# Patient Record
Sex: Female | Born: 1960 | Race: Black or African American | Smoking: Former smoker
Health system: Southern US, Community
[De-identification: ages and names within clinical notes are randomized; demographics above are authoritative.]

## PROBLEM LIST (undated history)

## (undated) DIAGNOSIS — K219 Gastro-esophageal reflux disease without esophagitis: Secondary | ICD-10-CM

## (undated) DIAGNOSIS — D332 Benign neoplasm of brain, unspecified: Secondary | ICD-10-CM

## (undated) HISTORY — PX: CHOLECYSTECTOMY: SHX55

## (undated) HISTORY — PX: ABDOMINAL HYSTERECTOMY: SHX81

## (undated) HISTORY — PX: KNEE SURGERY: SHX244

## (undated) HISTORY — PX: VENTRICULOPERITONEAL SHUNT: SHX204

---

## 2015-05-11 ENCOUNTER — Ambulatory Visit: Payer: Self-pay | Admitting: Physical Therapy

## 2015-05-26 ENCOUNTER — Ambulatory Visit: Payer: Medicaid Other | Attending: Anesthesiology | Admitting: Physical Therapy

## 2015-05-26 DIAGNOSIS — M545 Low back pain: Secondary | ICD-10-CM

## 2015-05-26 DIAGNOSIS — R209 Unspecified disturbances of skin sensation: Secondary | ICD-10-CM

## 2015-05-26 DIAGNOSIS — R208 Other disturbances of skin sensation: Secondary | ICD-10-CM | POA: Insufficient documentation

## 2015-05-26 DIAGNOSIS — M5416 Radiculopathy, lumbar region: Secondary | ICD-10-CM | POA: Diagnosis present

## 2015-05-26 DIAGNOSIS — R29898 Other symptoms and signs involving the musculoskeletal system: Secondary | ICD-10-CM

## 2015-05-26 NOTE — Patient Instructions (Signed)
  Copyright  VHI. All rights reserved.   Pelvic Tilt   Flatten back by tightening stomach muscles and buttocks. Repeat _10__ times per set. Do ___2_ sets per session. Do __2__ sessions per day.  http://orth.exer.us/134   Copyright  VHI. All rights reserved. Knee to Chest (Flexion)   Pull knee toward chest. Feel stretch in lower back or buttock area. Breathing deeply, Hold _20-30___ seconds. Repeat with other knee. Repeat __2-3__ times. Do ___2_ sessions per day.  http://gt2.exer.us/225   Copyright  VHI. All rights reserved.   Lower Trunk Rotation Stretch   Keeping back flat and feet together, rotate knees to left side. Hold __10_ seconds. Repeat __10__ times per set. Do ___2_ sets per session. Do _2___ sessions per day.  http://orth.exer.us/122   Copyright  VHI. All rights reserved.  Supine: Leg Stretch With Strap (Basic)   Lie on back with one knee bent, foot flat on floor. Hook strap around other foot. Straighten knee. Keep knee level with other knee. Hold ___30 seconds. Relax leg completely down to floor.  Repeat _2-3__ times per session. Do __2_ sessions per day.  Copyright  VHI. All rights reserved.    http://gt2.exer.us/247   HIP: Hamstrings - Short Sitting   Rest leg on raised surface. Keep knee straight. Lift chest. Hold __30 seconds. __3_ reps per set, __2_ sets per day, 5-7__ days per week  Copyright  VHI. All rights reserved.

## 2015-05-26 NOTE — Therapy (Addendum)
[facility] [place], [place], [address] Phone: [phone]   Fax:  [phone]  Physical Therapy Evaluation/Discharge  Patient Details  Name: Wendy Aguirre MRN: [id number] Date of Birth: 24-Feb-1961 Referring Provider:  Shanon Ace,*  Encounter Date: 05/26/2015      PT End of Session - 05/26/15 1547    Visit Number 1   Number of Visits 1   PT Start Time 1420   PT Stop Time 1530   PT Time Calculation (min) 70 min   Activity Tolerance Patient tolerated treatment well      No past medical history on file.  No past surgical history on file.  There were no vitals filed for this visit.  Visit Diagnosis:  Left lumbar radiculopathy  Weakness of left lower extremity  Sensory disturbance  Left low back pain, with sciatica presence unspecified      Subjective Assessment - 05/26/15 1426    Subjective Pt has central low back pain,  L hip pain which radiates to L lower leg.  She reports numbness, tingling and LLE weakness.  Symptoms have been going on >8 mos.    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 5 min   How long can you stand comfortably? 3 min    How long can you walk comfortably? hip pops with walking, 5 min    Diagnostic tests MRI L Spine   Currently in Pain? Yes   Pain Score 8    Pain Location Back  Hip   Pain Orientation Left;Proximal   Pain Descriptors / Indicators Constant;Burning;Aching   Pain Type Chronic pain   Pain Onset More than a month ago   Pain Frequency Constant   Aggravating Factors  pronlonged positions   Pain Relieving Factors nothing really (ice > heat)   Multiple Pain Sites No            [facility] PT Assessment - 05/26/15 1434    Assessment   Medical Diagnosis lumbago, L hip   Onset Date/Surgical Date 09/25/14   Next MD Visit unknown   Prior Therapy for knee in [place]   Precautions   Precautions None   Restrictions   Weight Bearing Restrictions No   Balance Screen   Has the patient fallen in the past 6 months No   Has the patient had a decrease in activity level because of a fear of falling?  No   Is the patient reluctant to leave their home because of a fear of falling?  No   Home Ecologist residence   Prior Function   Level of Independence Independent   Vocation On disability   Vocation Requirements moved from [place] recently   Cognition   Overall Cognitive Status Within Functional Limits for tasks assessed   Sensation   Light Touch Impaired by gross assessment  LLE   Coordination   Gross Motor Movements are Fluid and Coordinated Not tested   AROM   Lumbar Flexion 45   Lumbar Extension 15  pain   Lumbar - Right Side Bend pain   Lumbar - Left Side Bend min pain   Lumbar - Right Rotation 50% min pain   Lumbar - Left Rotation 50% min pain   Strength   Right Hip Flexion 4/5   Left Hip Flexion 3-/5   Right Knee Flexion 4/5   Right Knee Extension 5/5   Left Knee Flexion  3+/5   Left Knee Extension 3+/5   Right/Left Ankle Left   Right Ankle Dorsiflexion 5/5   Left Ankle Dorsiflexion 3+/5   Palpation   Palpation comment painful with all palpation to L glute, L lateral hip and into Lt. ITB, quads   Straight Leg Raise   Findings Positive   Side  Right   Comment --  pain end range on Lt. LE, limited by knee pain also           PT Education - 05/26/15 1546    Education provided Yes   Education Details PT/POC, HEP, posture and body mech, TENS contact (hers does not work) and Geologist, engineering) Educated Patient   Methods Explanation;Demonstration;Handout   Comprehension Verbalized understanding;Returned demonstration             PT Long Term Goals - 05/26/15 1550    PT LONG TERM GOAL #1   Title No goals due to no follow up- no PT coverage               Plan - 05/26/15 1547     Clinical Impression Statement Patient with symptoms consistent with L lumbar radiculopathy. Lumbar traction indicated due to pos SLR on uninvolved side.  Reported min to no leg pain post treatment.  Unfortunately no coverage for PT with [facility].  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 Asst.    Pt will benefit from skilled therapeutic intervention in order to improve on the following deficits Decreased range of motion;Difficulty walking;Increased fascial restricitons;Obesity;Decreased endurance;Decreased activity tolerance;Pain;Improper body mechanics;Impaired flexibility;Hypomobility;Decreased mobility;Decreased strength;Impaired sensation;Postural dysfunction   Rehab Potential Good   PT Frequency One time visit   PT Treatment/Interventions Traction;Therapeutic exercise;ADLs/Self Care Home Management   PT Next Visit Plan NA   PT Home Exercise Plan given basic back ex   Consulted and Agree with Plan of Care Patient         Problem List There are no active problems to display for this patient.   PAA,JENNIFER 05/26/2015, 3:53 PM  [facility] [address] [place], [place], [address] Phone: [phone]   Fax:  [phone]   Raeford Razor, PT 05/26/2015 3:54 PM Phone: [phone] Fax: [phone]

## 2015-07-13 ENCOUNTER — Other Ambulatory Visit: Payer: Self-pay | Admitting: Family Medicine

## 2015-07-13 DIAGNOSIS — Z1231 Encounter for screening mammogram for malignant neoplasm of breast: Secondary | ICD-10-CM

## 2015-07-23 ENCOUNTER — Ambulatory Visit: Payer: Medicaid Other

## 2015-08-05 ENCOUNTER — Ambulatory Visit: Payer: Medicaid Other

## 2015-08-23 ENCOUNTER — Other Ambulatory Visit: Payer: Self-pay

## 2015-08-23 DIAGNOSIS — Z1231 Encounter for screening mammogram for malignant neoplasm of breast: Secondary | ICD-10-CM

## 2015-09-09 ENCOUNTER — Ambulatory Visit
Admission: RE | Admit: 2015-09-09 | Discharge: 2015-09-09 | Disposition: A | Discharge status: Home / Self Care | Payer: Medicaid Other | Source: Ambulatory Visit

## 2015-09-09 DIAGNOSIS — Z1231 Encounter for screening mammogram for malignant neoplasm of breast: Secondary | ICD-10-CM

## 2015-10-05 ENCOUNTER — Encounter (HOSPITAL_COMMUNITY): Payer: Self-pay

## 2015-10-05 ENCOUNTER — Emergency Department (HOSPITAL_COMMUNITY)
Admission: EM | Admit: 2015-10-05 | Discharge: 2015-10-05 | Disposition: A | Discharge status: Home / Self Care | Payer: Medicare Other | Attending: Emergency Medicine | Admitting: Emergency Medicine

## 2015-10-05 ENCOUNTER — Emergency Department (HOSPITAL_COMMUNITY): Payer: Medicare Other

## 2015-10-05 DIAGNOSIS — Z87891 Personal history of nicotine dependence: Secondary | ICD-10-CM | POA: Insufficient documentation

## 2015-10-05 DIAGNOSIS — Z880 Allergy status to penicillin: Secondary | ICD-10-CM | POA: Diagnosis not present

## 2015-10-05 DIAGNOSIS — Z3202 Encounter for pregnancy test, result negative: Secondary | ICD-10-CM | POA: Insufficient documentation

## 2015-10-05 DIAGNOSIS — R109 Unspecified abdominal pain: Secondary | ICD-10-CM | POA: Insufficient documentation

## 2015-10-05 DIAGNOSIS — K219 Gastro-esophageal reflux disease without esophagitis: Secondary | ICD-10-CM | POA: Insufficient documentation

## 2015-10-05 DIAGNOSIS — Z79899 Other long term (current) drug therapy: Secondary | ICD-10-CM | POA: Diagnosis not present

## 2015-10-05 DIAGNOSIS — Z86011 Personal history of benign neoplasm of the brain: Secondary | ICD-10-CM | POA: Insufficient documentation

## 2015-10-05 DIAGNOSIS — R0781 Pleurodynia: Secondary | ICD-10-CM | POA: Diagnosis not present

## 2015-10-05 HISTORY — DX: Gastro-esophageal reflux disease without esophagitis: K21.9

## 2015-10-05 HISTORY — DX: Benign neoplasm of brain, unspecified: D33.2

## 2015-10-05 LAB — CBC WITH DIFFERENTIAL/PLATELET
BASOS ABS: 0.1 10*3/uL (ref 0.0–0.1)
BASOS PCT: 2 %
EOS ABS: 0.2 10*3/uL (ref 0.0–0.7)
Eosinophils Relative: 4 %
HCT: 41.2 % (ref 36.0–46.0)
HEMOGLOBIN: 13.2 g/dL (ref 12.0–15.0)
Lymphocytes Relative: 40 %
Lymphs Abs: 2.4 10*3/uL (ref 0.7–4.0)
MCH: 28 pg (ref 26.0–34.0)
MCHC: 32 g/dL (ref 30.0–36.0)
MCV: 87.5 fL (ref 78.0–100.0)
Monocytes Absolute: 0.3 10*3/uL (ref 0.1–1.0)
Monocytes Relative: 5 %
NEUTROS PCT: 49 %
Neutro Abs: 3.1 10*3/uL (ref 1.7–7.7)
Platelets: 311 10*3/uL (ref 150–400)
RBC: 4.71 MIL/uL (ref 3.87–5.11)
RDW: 15 % (ref 11.5–15.5)
WBC: 6.1 10*3/uL (ref 4.0–10.5)

## 2015-10-05 LAB — COMPREHENSIVE METABOLIC PANEL
ALBUMIN: 4.3 g/dL (ref 3.5–5.0)
ALK PHOS: 128 U/L — AB (ref 38–126)
ALT: 39 U/L (ref 14–54)
AST: 39 U/L (ref 15–41)
Anion gap: 10 (ref 5–15)
BUN: 11 mg/dL (ref 6–20)
CALCIUM: 9.7 mg/dL (ref 8.9–10.3)
CO2: 24 mmol/L (ref 22–32)
CREATININE: 1.07 mg/dL — AB (ref 0.44–1.00)
Chloride: 106 mmol/L (ref 101–111)
GFR calc Af Amer: >60 mL/min (ref >60)
GFR calc non Af Amer: 58 mL/min — ABNORMAL LOW (ref >60)
GLUCOSE: 148 mg/dL — AB (ref 65–99)
Potassium: 4.8 mmol/L (ref 3.5–5.1)
SODIUM: 140 mmol/L (ref 135–145)
Total Bilirubin: 0.9 mg/dL (ref 0.3–1.2)
Total Protein: 7.7 g/dL (ref 6.5–8.1)

## 2015-10-05 LAB — URINALYSIS, ROUTINE W REFLEX MICROSCOPIC
BILIRUBIN URINE: NEGATIVE
Glucose, UA: NEGATIVE mg/dL
Hgb urine dipstick: NEGATIVE
KETONES UR: NEGATIVE mg/dL
Leukocytes, UA: NEGATIVE
NITRITE: NEGATIVE
Protein, ur: NEGATIVE mg/dL
SPECIFIC GRAVITY, URINE: 1.028 (ref 1.005–1.030)
pH: 6.5 (ref 5.0–8.0)

## 2015-10-05 LAB — LIPASE, BLOOD: Lipase: 27 U/L (ref 11–51)

## 2015-10-05 LAB — HCG, QUANTITATIVE, PREGNANCY: HCG, BETA CHAIN, QUANT, S: 3 m[IU]/mL (ref <5)

## 2015-10-05 MED ORDER — SODIUM CHLORIDE 0.9 % IV BOLUS (SEPSIS)
1000.0000 mL | Freq: Once | INTRAVENOUS | Status: AC
  Administered 2015-10-05: 1000 mL via INTRAVENOUS

## 2015-10-05 MED ORDER — MORPHINE SULFATE (PF) 4 MG/ML IV SOLN
4.0000 mg | Freq: Once | INTRAVENOUS | Status: AC
  Administered 2015-10-05: 4 mg via INTRAVENOUS
  Filled 2015-10-05: qty 1

## 2015-10-05 MED ORDER — GI COCKTAIL ~~LOC~~
30.0000 mL | Freq: Once | ORAL | Status: AC
Start: 2015-10-05 — End: 2015-10-05
  Administered 2015-10-05: 30 mL via ORAL
  Filled 2015-10-05: qty 30

## 2015-10-05 MED ORDER — ONDANSETRON HCL 4 MG/2ML IJ SOLN
4.0000 mg | Freq: Once | INTRAMUSCULAR | Status: AC
  Administered 2015-10-05: 4 mg via INTRAVENOUS
  Filled 2015-10-05: qty 2

## 2015-10-05 NOTE — Discharge Instructions (Signed)
Flank Pain °Flank pain refers to pain that is located on the side of the body between the upper abdomen and the back. The pain may occur over a short period of time (acute) or may be long-term or reoccurring (chronic). It may be mild or severe. Flank pain can be caused by many things. °CAUSES  °Some of the more common causes of flank pain include: °· Muscle strains.   °· Muscle spasms.   °· A disease of your spine (vertebral disk disease).   °· A lung infection (pneumonia).   °· Fluid around your lungs (pulmonary edema).   °· A kidney infection.   °· Kidney stones.   °· A very painful skin rash caused by the chickenpox virus (shingles).   °· Gallbladder disease.   °HOME CARE INSTRUCTIONS  °Home care will depend on the cause of your pain. In general, °· Rest as directed by your caregiver. °· Drink enough fluids to keep your urine clear or pale yellow. °· Only take over-the-counter or prescription medicines as directed by your caregiver. Some medicines may help relieve the pain. °· Tell your caregiver about any changes in your pain. °· Follow up with your caregiver as directed. °SEEK IMMEDIATE MEDICAL CARE IF:  °· Your pain is not controlled with medicine.   °· You have new or worsening symptoms. °· Your pain increases.   °· You have abdominal pain.   °· You have shortness of breath.   °· You have persistent nausea or vomiting.   °· You have swelling in your abdomen.   °· You feel faint or pass out.   °· You have blood in your urine. °· You have a fever or persistent symptoms for more than 2-3 days. °· You have a fever and your symptoms suddenly get worse. °MAKE SURE YOU:  °· Understand these instructions. °· Will watch your condition. °· Will get help right away if you are not doing well or get worse. °  °This information is not intended to replace advice given to you by your health care provider. Make sure you discuss any questions you have with your health care provider. °  °Document Released: 12/21/2005 Document  Revised: 07/24/2012 Document Reviewed: 06/13/2012 °Elsevier Interactive Patient Education ©2016 Elsevier Inc. ° °

## 2015-10-05 NOTE — ED Notes (Signed)
Pt. Presents with complaint of R flank pain. Pt. States pain has been presents for approx 2 months, but has worsened over the past 2 days. Pt. Ambulatory, AxO x4. Pt. Denies urinary symptoms. 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

## 2015-10-05 NOTE — ED Provider Notes (Signed)
CSN: [id number]     Arrival date & time 10/05/15  1033 History   First MD Initiated Contact with Patient 10/05/15 1035     Chief Complaint  Patient presents with  . Flank Pain     (Consider location/radiation/quality/duration/timing/severity/associated sxs/prior Treatment) Patient is a 54 y.o. female presenting with flank pain. The history is provided by the patient.  Flank Pain This is a new problem. The current episode started more than 1 week ago. The problem occurs constantly. The problem has not changed since onset.Pertinent negatives include no chest pain, no abdominal pain, no headaches and no shortness of breath. Nothing aggravates the symptoms. Nothing relieves the symptoms. She has tried nothing for the symptoms. The treatment provided no relief.    54 yo F with a chief complaint of right flank pain. This been going on for about 2 months. Patient has been seen at an outside ED had a CT scan with contrast that was negative for acute process. Patient has a history of a VP shunt, and has a history of chronic pain related to her right side. Patient is seen currently by pain management. Patient is also obtaining narcotics from her family doctor. Patient also has a history of chronic abdominal pain for which she sees GI. Currently thought that her recurrent pain is secondary to GERD. Currently getting 120 15 mg oxycodone tablets for 30 day supply. Last filled 8 days ago. Patient denies any fevers or chills has nausea but denies vomiting. Patient denies any dysuria. Pain is constant worse with movement or walking. Patient denies history of nephrolithiasis.  Past Medical History  Diagnosis Date  . GERD (gastroesophageal reflux disease)   . Brain tumor (benign) [facility])    Past Surgical History  Procedure Laterality Date  . Ventriculoperitoneal shunt    . Abdominal hysterectomy    . Cholecystectomy    . Knee surgery      L side   No family history on file. Social History  Substance Use  Topics  . Smoking status: Former Research scientist (life sciences)  . Smokeless tobacco: None  . Alcohol Use: No   OB History    No data available     Review of Systems  Constitutional: Negative for fever and chills.  HENT: Negative for congestion and rhinorrhea.   Eyes: Negative for redness and visual disturbance.  Respiratory: Negative for shortness of breath and wheezing.   Cardiovascular: Negative for chest pain and palpitations.  Gastrointestinal: Negative for nausea, vomiting, abdominal pain and abdominal distention.  Genitourinary: Positive for flank pain (R side). Negative for dysuria, urgency, hematuria, decreased urine volume, vaginal bleeding, vaginal discharge and difficulty urinating.  Musculoskeletal: Negative for myalgias and arthralgias.  Skin: Negative for pallor and wound.  Neurological: Negative for dizziness and headaches.      Allergies  Lamotrigine; Penicillins; and Carbamazepine  Home Medications   Prior to Admission medications   Medication Sig Start Date End Date Taking? Authorizing Provider  cyclobenzaprine (FLEXERIL) 10 MG tablet Take 10 mg by mouth 3 (three) times daily as needed for muscle spasms.   Yes Historical Provider, MD  gabapentin (NEURONTIN) 800 MG tablet Take 800 mg by mouth 3 (three) times daily.   Yes Historical Provider, MD  omeprazole (PRILOSEC) 40 MG capsule Take 40 mg by mouth 2 (two) times daily.    Yes Historical Provider, MD  ondansetron (ZOFRAN-ODT) 4 MG disintegrating tablet Take 4 mg by mouth every 8 (eight) hours as needed for nausea or vomiting.   Yes Historical Provider,  MD  oxyCODONE-acetaminophen (PERCOCET/ROXICET) 5-325 MG per tablet Take by mouth every 4 (four) hours as needed for severe pain.   Yes Historical Provider, MD  rOPINIRole (REQUIP) 0.5 MG tablet Take 0.5 mg by mouth 3 (three) times daily.   Yes Historical Provider, MD   BP 125/78 mmHg  Pulse 62  Temp(Src) 97.3 F (36.3 C) (Oral)  Resp 16  Ht 5\' 2"  (1.575 m)  Wt 179 lb (81.194 kg)   BMI 32.73 kg/m2  SpO2 98% Physical Exam  Constitutional: She is oriented to person, place, and time. She appears well-developed and well-nourished. No distress.  HENT:  Head: Normocephalic and atraumatic.  Eyes: EOM are normal. Pupils are equal, round, and reactive to light.  Neck: Normal range of motion. Neck supple.  Cardiovascular: Normal rate and regular rhythm.  Exam reveals no gallop and no friction rub.   No murmur heard. Pulmonary/Chest: Effort normal. She has no wheezes. She has no rales. She exhibits tenderness (tender to palpation right about the right posterior rib angles about ribs 8 through 10).  Abdominal: Soft. She exhibits no distension. There is no tenderness. There is no rebound and no guarding.  Mild right sided cva ttp  Musculoskeletal: She exhibits no edema or tenderness.  Neurological: She is alert and oriented to person, place, and time.  Skin: Skin is warm and dry. She is not diaphoretic.  Psychiatric: She has a normal mood and affect. Her behavior is normal.  Nursing note and vitals reviewed.   ED Course  Procedures (including critical care time) Labs Review Labs Reviewed  URINALYSIS, ROUTINE W REFLEX MICROSCOPIC (NOT AT [facility]) - Abnormal; Notable for the following:    APPearance CLOUDY (*)    All other components within normal limits  COMPREHENSIVE METABOLIC PANEL - Abnormal; Notable for the following:    Glucose, Bld 148 (*)    Creatinine, Ser 1.07 (*)    Alkaline Phosphatase 128 (*)    GFR calc non Af Amer 58 (*)    All other components within normal limits  CBC WITH DIFFERENTIAL/PLATELET  LIPASE, BLOOD  HCG, QUANTITATIVE, PREGNANCY    Imaging Review Ct Renal Stone Study  10/05/2015  CLINICAL DATA:  Right flank pain for 2 months, worse recently EXAM: CT ABDOMEN AND PELVIS WITHOUT CONTRAST TECHNIQUE: Multidetector CT imaging of the abdomen and pelvis was performed following the standard protocol without IV contrast. COMPARISON:  None. FINDINGS:  The lung bases are clear. The liver is unremarkable on this unenhanced study. Surgical clips are present from prior cholecystectomy. The pancreas is normal in size and the pancreatic duct is not dilated. The adrenal glands and spleen are unremarkable. There does appear to be a small hiatal hernia present. Otherwise the stomach is decompressed and unremarkable. The descending duodenum appears somewhat dilated, a finding of questionable significance. No renal calculi are seen. There is no evidence of hydronephrosis. The proximal ureters are normal in caliber. The abdominal aorta is normal in caliber. A probable VP shunt catheter is noted with the tip extending into the anterior left pelvis. There is a tiny amount of fluid within the pelvis of questionable significance. This may represent a recently ruptured ovarian cyst with ovaries appear normal in size. The uterus has previously been resected. The colon is largely decompressed. The terminal ileum is unremarkable. The appendix is not definitely seen. The lumbar vertebrae are in normal alignment with normal intervertebral disc spaces. IMPRESSION: 1. No explanation for the patient's right flank pain is seen. No renal or ureteral  calculi are noted. 2. Small amount of free fluid in the pelvis may be due to a recently ruptured ovarian cyst. The uterus has previously been resected. 3. The terminal ileum is unremarkable. The appendix is not visualized. 4. Apparent VP shunt catheter present with the tip in the anterior left pelvis. Electronically Signed   By: Ivar Drape M.D.   On: 10/05/2015 13:14   I have personally reviewed and evaluated these images and lab results as part of my medical decision-making.   EKG Interpretation None      MDM   Final diagnoses:  Right flank pain    54 yo F with a chief complaint of right flank pain. This been going on for at least couple months. Patient is convinced that this has something to do with her VP shunt. Will obtain a  CT stone study to evaluate for possible malfunction versus nephrolithiasis.   Patient with no acute findings on the CT scan. Reassessed and feeling mildly better. Discharge home to follow-up with her GI doctor.  3:18 PM:  I have discussed the diagnosis/risks/treatment options with the patient and believe the pt to be eligible for discharge home to follow-up with GI. We also discussed returning to the ED immediately if new or worsening sx occur. We discussed the sx which are most concerning (e.g., sudden worsening pain, fever, inability to tolerate by mouth ) that necessitate immediate return. Medications administered to the patient during their visit and any new prescriptions provided to the patient are listed below.  Medications given during this visit Medications  morphine 4 MG/ML injection 4 mg (4 mg Intravenous Given 10/05/15 1106)  ondansetron (ZOFRAN) injection 4 mg (4 mg Intravenous Given 10/05/15 1106)  sodium chloride 0.9 % bolus 1,000 mL (0 mLs Intravenous Stopped 10/05/15 1329)  gi cocktail (Maalox,Lidocaine,Donnatal) (30 mLs Oral Given 10/05/15 1113)    Discharge Medication List as of 10/05/2015  1:22 PM      The patient appears reasonably screen and/or stabilized for discharge and I doubt any other medical condition or other [facility] requiring further screening, evaluation, or treatment in the ED at this time prior to discharge.    Deno Etienne, DO 10/05/15 [phone]

## 2015-11-24 ENCOUNTER — Emergency Department (HOSPITAL_COMMUNITY)
Admission: EM | Admit: 2015-11-24 | Discharge: 2015-11-24 | Disposition: A | Discharge status: Home / Self Care | Payer: Medicare Other | Attending: Emergency Medicine | Admitting: Emergency Medicine

## 2015-11-24 ENCOUNTER — Encounter (HOSPITAL_COMMUNITY): Payer: Self-pay | Admitting: Emergency Medicine

## 2015-11-24 ENCOUNTER — Emergency Department (HOSPITAL_COMMUNITY): Payer: Medicare Other

## 2015-11-24 DIAGNOSIS — Y92410 Unspecified street and highway as the place of occurrence of the external cause: Secondary | ICD-10-CM | POA: Insufficient documentation

## 2015-11-24 DIAGNOSIS — S199XXA Unspecified injury of neck, initial encounter: Secondary | ICD-10-CM | POA: Diagnosis present

## 2015-11-24 DIAGNOSIS — K219 Gastro-esophageal reflux disease without esophagitis: Secondary | ICD-10-CM | POA: Diagnosis not present

## 2015-11-24 DIAGNOSIS — Y998 Other external cause status: Secondary | ICD-10-CM | POA: Insufficient documentation

## 2015-11-24 DIAGNOSIS — S4991XA Unspecified injury of right shoulder and upper arm, initial encounter: Secondary | ICD-10-CM | POA: Diagnosis not present

## 2015-11-24 DIAGNOSIS — S161XXA Strain of muscle, fascia and tendon at neck level, initial encounter: Secondary | ICD-10-CM | POA: Insufficient documentation

## 2015-11-24 DIAGNOSIS — Z79899 Other long term (current) drug therapy: Secondary | ICD-10-CM | POA: Diagnosis not present

## 2015-11-24 DIAGNOSIS — Z880 Allergy status to penicillin: Secondary | ICD-10-CM | POA: Diagnosis not present

## 2015-11-24 DIAGNOSIS — Y9389 Activity, other specified: Secondary | ICD-10-CM | POA: Insufficient documentation

## 2015-11-24 DIAGNOSIS — S4992XA Unspecified injury of left shoulder and upper arm, initial encounter: Secondary | ICD-10-CM | POA: Diagnosis not present

## 2015-11-24 DIAGNOSIS — Z87891 Personal history of nicotine dependence: Secondary | ICD-10-CM | POA: Insufficient documentation

## 2015-11-24 DIAGNOSIS — Z86011 Personal history of benign neoplasm of the brain: Secondary | ICD-10-CM | POA: Insufficient documentation

## 2015-11-24 DIAGNOSIS — S0990XA Unspecified injury of head, initial encounter: Secondary | ICD-10-CM | POA: Insufficient documentation

## 2015-11-24 DIAGNOSIS — S3992XA Unspecified injury of lower back, initial encounter: Secondary | ICD-10-CM | POA: Diagnosis not present

## 2015-11-24 MED ORDER — HYDROCODONE-ACETAMINOPHEN 5-325 MG PO TABS: 1.0000 | ORAL_TABLET | Freq: Four times a day (QID) | ORAL | Status: AC | PRN

## 2015-11-24 MED ORDER — METHOCARBAMOL 500 MG PO TABS: 500.0000 mg | ORAL_TABLET | Freq: Two times a day (BID) | ORAL | Status: DC

## 2015-11-24 MED ORDER — IBUPROFEN 200 MG PO TABS
600.0000 mg | ORAL_TABLET | Freq: Once | ORAL | Status: DC
  Filled 2015-11-24: qty 3

## 2015-11-24 MED ORDER — HYDROCODONE-ACETAMINOPHEN 5-325 MG PO TABS
1.0000 | ORAL_TABLET | Freq: Once | ORAL | Status: AC
  Administered 2015-11-24: 1 via ORAL
  Filled 2015-11-24: qty 1
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
## 2015-11-24 NOTE — ED Notes (Signed)
Patient transported to X-ray 

## 2015-11-25 ENCOUNTER — Encounter (HOSPITAL_COMMUNITY): Payer: Self-pay | Admitting: Emergency Medicine

## 2015-11-25 ENCOUNTER — Emergency Department (HOSPITAL_COMMUNITY)
Admission: EM | Admit: 2015-11-25 | Discharge: 2015-11-25 | Disposition: A | Discharge status: Home / Self Care | Payer: Medicare Other | Attending: Emergency Medicine | Admitting: Emergency Medicine

## 2015-11-25 ENCOUNTER — Emergency Department (HOSPITAL_COMMUNITY): Payer: Medicare Other

## 2015-11-25 DIAGNOSIS — S199XXA Unspecified injury of neck, initial encounter: Secondary | ICD-10-CM | POA: Diagnosis not present

## 2015-11-25 DIAGNOSIS — Y998 Other external cause status: Secondary | ICD-10-CM | POA: Diagnosis not present

## 2015-11-25 DIAGNOSIS — K219 Gastro-esophageal reflux disease without esophagitis: Secondary | ICD-10-CM | POA: Insufficient documentation

## 2015-11-25 DIAGNOSIS — Y92410 Unspecified street and highway as the place of occurrence of the external cause: Secondary | ICD-10-CM | POA: Diagnosis not present

## 2015-11-25 DIAGNOSIS — S0993XA Unspecified injury of face, initial encounter: Secondary | ICD-10-CM | POA: Diagnosis not present

## 2015-11-25 DIAGNOSIS — Y9389 Activity, other specified: Secondary | ICD-10-CM | POA: Diagnosis not present

## 2015-11-25 DIAGNOSIS — Z86018 Personal history of other benign neoplasm: Secondary | ICD-10-CM | POA: Insufficient documentation

## 2015-11-25 DIAGNOSIS — Z880 Allergy status to penicillin: Secondary | ICD-10-CM | POA: Diagnosis not present

## 2015-11-25 DIAGNOSIS — R519 Headache, unspecified: Secondary | ICD-10-CM

## 2015-11-25 DIAGNOSIS — Z87891 Personal history of nicotine dependence: Secondary | ICD-10-CM | POA: Insufficient documentation

## 2015-11-25 DIAGNOSIS — Z79899 Other long term (current) drug therapy: Secondary | ICD-10-CM | POA: Diagnosis not present

## 2015-11-25 DIAGNOSIS — R51 Headache: Secondary | ICD-10-CM

## 2015-11-25 MED ORDER — ONDANSETRON 4 MG PO TBDP
4.0000 mg | ORAL_TABLET | Freq: Once | ORAL | Status: AC
  Administered 2015-11-25: 4 mg via ORAL
  Filled 2015-11-25: qty 1

## 2015-11-25 MED ORDER — OXYCODONE-ACETAMINOPHEN 5-325 MG PO TABS
1.0000 | ORAL_TABLET | Freq: Once | ORAL | Status: AC
  Administered 2015-11-25: 1 via ORAL
  Filled 2015-11-25: qty 1
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
## 2015-11-25 NOTE — ED Notes (Signed)
Off floor for testing 
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
## 2015-11-25 NOTE — ED Notes (Signed)
Bed: WA26 Expected date:  Expected time:  Means of arrival:  Comments: 

## 2016-02-17 ENCOUNTER — Encounter (HOSPITAL_COMMUNITY): Payer: Self-pay | Admitting: Emergency Medicine

## 2016-02-17 ENCOUNTER — Emergency Department (HOSPITAL_COMMUNITY)
Admission: EM | Admit: 2016-02-17 | Discharge: 2016-02-17 | Disposition: A | Discharge status: Home / Self Care | Payer: Medicare Other | Attending: Emergency Medicine | Admitting: Emergency Medicine

## 2016-02-17 DIAGNOSIS — K219 Gastro-esophageal reflux disease without esophagitis: Secondary | ICD-10-CM | POA: Diagnosis not present

## 2016-02-17 DIAGNOSIS — Z880 Allergy status to penicillin: Secondary | ICD-10-CM | POA: Insufficient documentation

## 2016-02-17 DIAGNOSIS — Z87891 Personal history of nicotine dependence: Secondary | ICD-10-CM | POA: Diagnosis not present

## 2016-02-17 DIAGNOSIS — Z79899 Other long term (current) drug therapy: Secondary | ICD-10-CM | POA: Insufficient documentation

## 2016-02-17 DIAGNOSIS — S8992XA Unspecified injury of left lower leg, initial encounter: Secondary | ICD-10-CM | POA: Diagnosis not present

## 2016-02-17 DIAGNOSIS — Y9289 Other specified places as the place of occurrence of the external cause: Secondary | ICD-10-CM | POA: Insufficient documentation

## 2016-02-17 DIAGNOSIS — S199XXA Unspecified injury of neck, initial encounter: Secondary | ICD-10-CM | POA: Diagnosis not present

## 2016-02-17 DIAGNOSIS — Y999 Unspecified external cause status: Secondary | ICD-10-CM | POA: Insufficient documentation

## 2016-02-17 DIAGNOSIS — Z86011 Personal history of benign neoplasm of the brain: Secondary | ICD-10-CM | POA: Insufficient documentation

## 2016-02-17 DIAGNOSIS — S3992XA Unspecified injury of lower back, initial encounter: Secondary | ICD-10-CM | POA: Insufficient documentation

## 2016-02-17 DIAGNOSIS — W010XXA Fall on same level from slipping, tripping and stumbling without subsequent striking against object, initial encounter: Secondary | ICD-10-CM | POA: Insufficient documentation

## 2016-02-17 DIAGNOSIS — Y9389 Activity, other specified: Secondary | ICD-10-CM | POA: Diagnosis not present

## 2016-02-17 DIAGNOSIS — S46911A Strain of unspecified muscle, fascia and tendon at shoulder and upper arm level, right arm, initial encounter: Secondary | ICD-10-CM

## 2016-02-17 DIAGNOSIS — S4991XA Unspecified injury of right shoulder and upper arm, initial encounter: Secondary | ICD-10-CM | POA: Diagnosis present

## 2016-02-17 MED ORDER — KETOROLAC TROMETHAMINE 60 MG/2ML IM SOLN
60.0000 mg | Freq: Once | INTRAMUSCULAR | Status: AC
  Administered 2016-02-17: 60 mg via INTRAMUSCULAR
  Filled 2016-02-17: qty 2

## 2016-02-17 NOTE — Discharge Instructions (Signed)
Continue taking your muscle relaxant and home pain medication as needed for your injury.  Use epsom salt and taking warm bath to help sooth your muscles.  Follow up with orthopedist as needed.  Return to the ER if you have any concerns.   RICE for Routine Care of Injuries Theroutine careofmanyinjuriesincludes rest, ice, compression, and elevation (RICE therapy). RICE therapy is often recommended for injuries to soft tissues, such as a muscle strain, ligament injuries, bruises, and overuse injuries. It can also be used for some bony injuries. Using RICE therapy can help to relieve pain, lessen swelling, and enable your body to heal. Rest Rest is required to allow your body to heal. This usually involves reducing your normal activities and avoiding use of the injured part of your body. Generally, you can return to your normal activities when you are comfortable and have been given permission by your health care provider. Ice Icing your injury helps to keep the swelling down, and it lessens pain. Do not apply ice directly to your skin.  Put ice in a plastic bag.  Place a towel between your skin and the bag.  Leave the ice on for 20 minutes, 2-3 times a day. Do this for as long as you are directed by your health care provider. Compression Compression means putting pressure on the injured area. Compression helps to keep swelling down, gives support, and helps with discomfort. Compression may be done with an elastic bandage. If an elastic bandage has been applied, follow these general tips:  Remove and reapply the bandage every 3-4 hours or as directed by your health care provider.  Make sure the bandage is not wrapped too tightly, because this can cut off circulation. If part of your body beyond the bandage becomes blue, numb, cold, swollen, or more painful, your bandage is most likely too tight. If this occurs, remove your bandage and reapply it more loosely.  See your health care provider if  the bandage seems to be making your problems worse rather than better. Elevation Elevation means keeping the injured area raised. This helps to lessen swelling and decrease pain. If possible, your injured area should be elevated at or above the level of your heart or the center of your chest. [place]? You should seek medical care if:  Your pain and swelling continue.  Your symptoms are getting worse rather than improving. These symptoms may indicate that further evaluation or further X-rays are needed. Sometimes, X-rays may not show a small broken bone (fracture) until a number of days later. Make a follow-up appointment with your health care provider. WHEN SHOULD I SEEK IMMEDIATE MEDICAL CARE? You should seek immediate medical care if:  You have sudden severe pain at or below the area of your injury.  You have redness or increased swelling around your injury.  You have tingling or numbness at or below the area of your injury that does not improve after you remove the elastic bandage.   This information is not intended to replace advice given to you by your health care provider. Make sure you discuss any questions you have with your health care provider.   Document Released: 02/11/2001 Document Revised: 07/21/2015 Document Reviewed: 10/07/2014 Elsevier Interactive Patient Education [employer].
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
## 2016-02-17 NOTE — ED Notes (Signed)
Pt reports that she slipped and fell yesterday. Pt reports right shoulder and distal arm pain and left knee pain. Pt alert x4. NAD at this time.

## 2017-04-09 IMAGING — CT CT RENAL STONE PROTOCOL
2 of 4 series · 11 of 46 positions shown, 12 images · non-contrast
Comparison: None.

CLINICAL DATA: Right flank pain for 2 months, worse recently

EXAM:
CT ABDOMEN AND PELVIS WITHOUT CONTRAST
TECHNIQUE: Multidetector CT imaging of the abdomen and pelvis was performed
following the standard protocol without IV contrast.

[Series 201: stone study, idose (2) · axial · 0.73mm/px · z∈[+537,+937]mm · 8 of 97 slices shown, 9 images]
[im 9/97  soft-tissue]
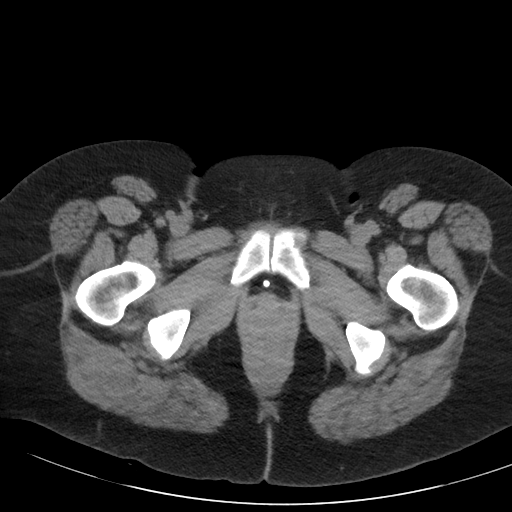
[im 9/97  bone]
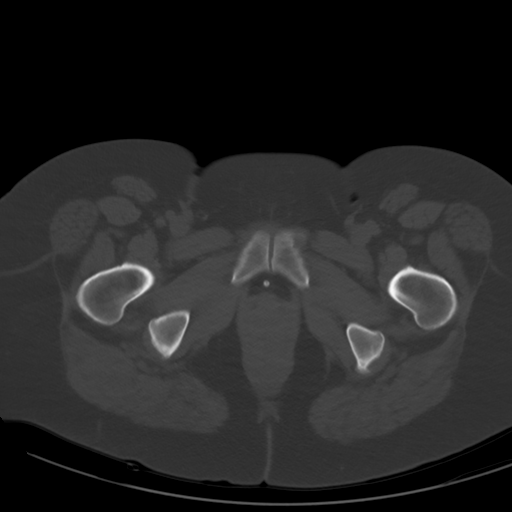
[im 21/97  soft-tissue]
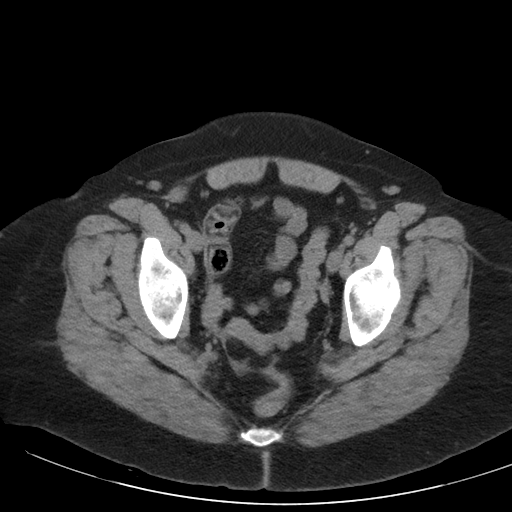
[im 33/97  soft-tissue]
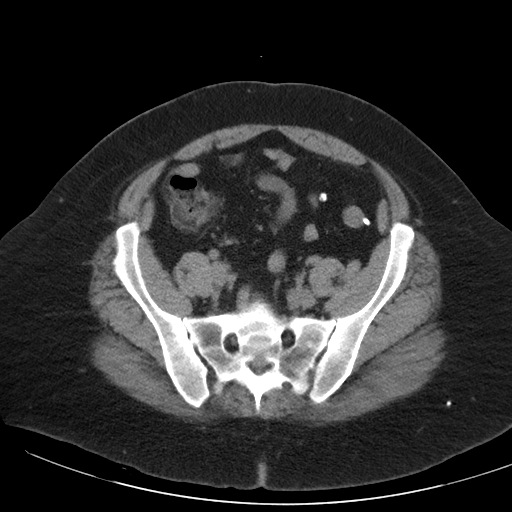
[im 45/97  soft-tissue]
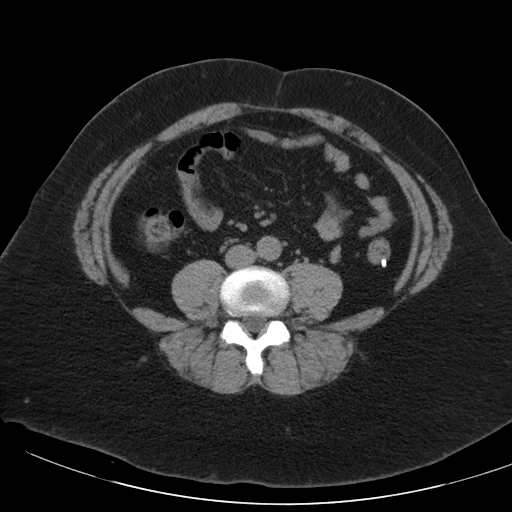
[im 53/97  soft-tissue]
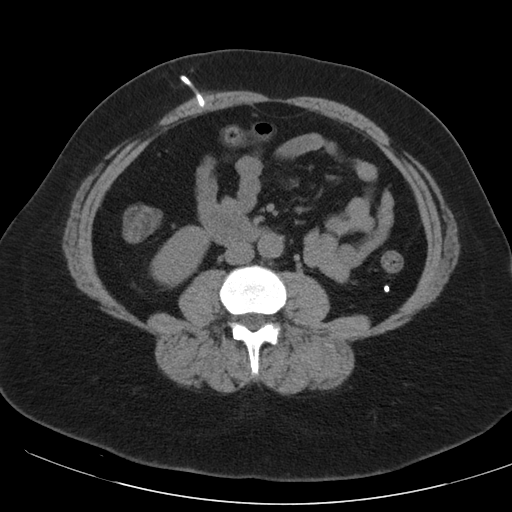
[im 65/97  soft-tissue]
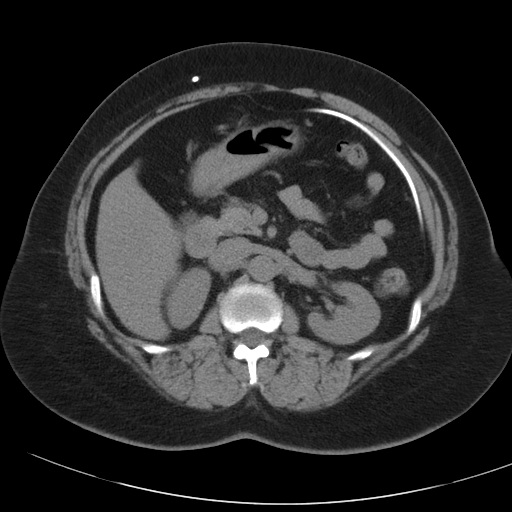
[im 77/97  soft-tissue]
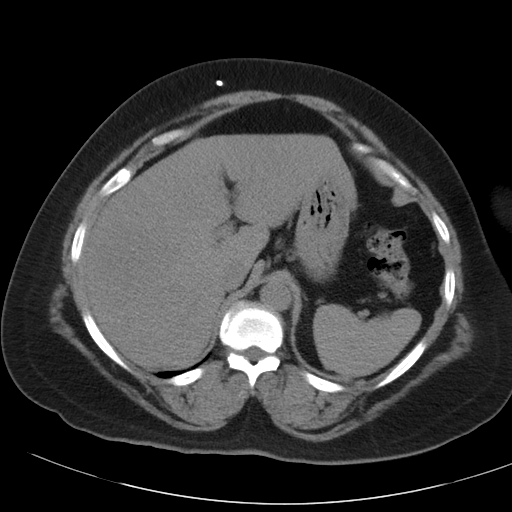
[im 89/97  soft-tissue]
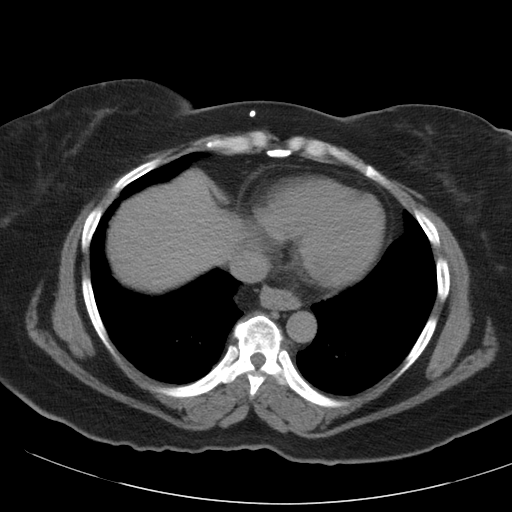

[Series 203: coronals, idose (2) · coronal · 0.45mm/px · 3 of 101 slices shown]
[im 34/101  soft-tissue]
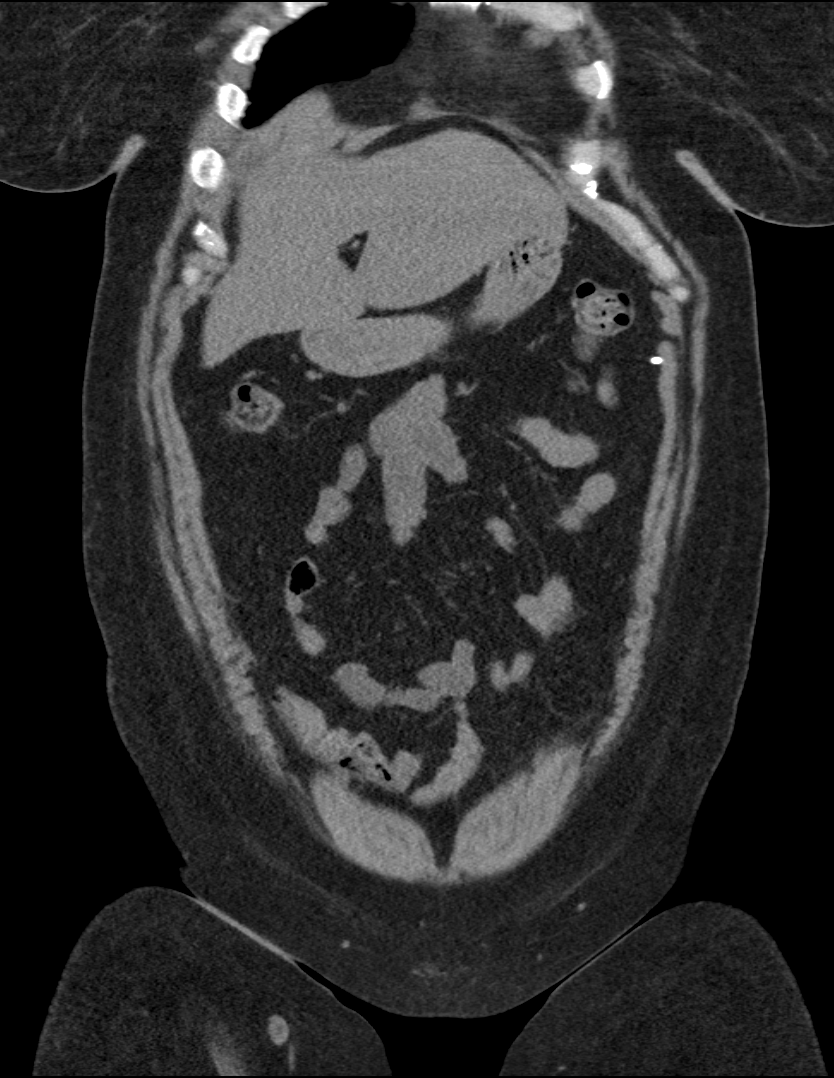
[im 45/101  soft-tissue]
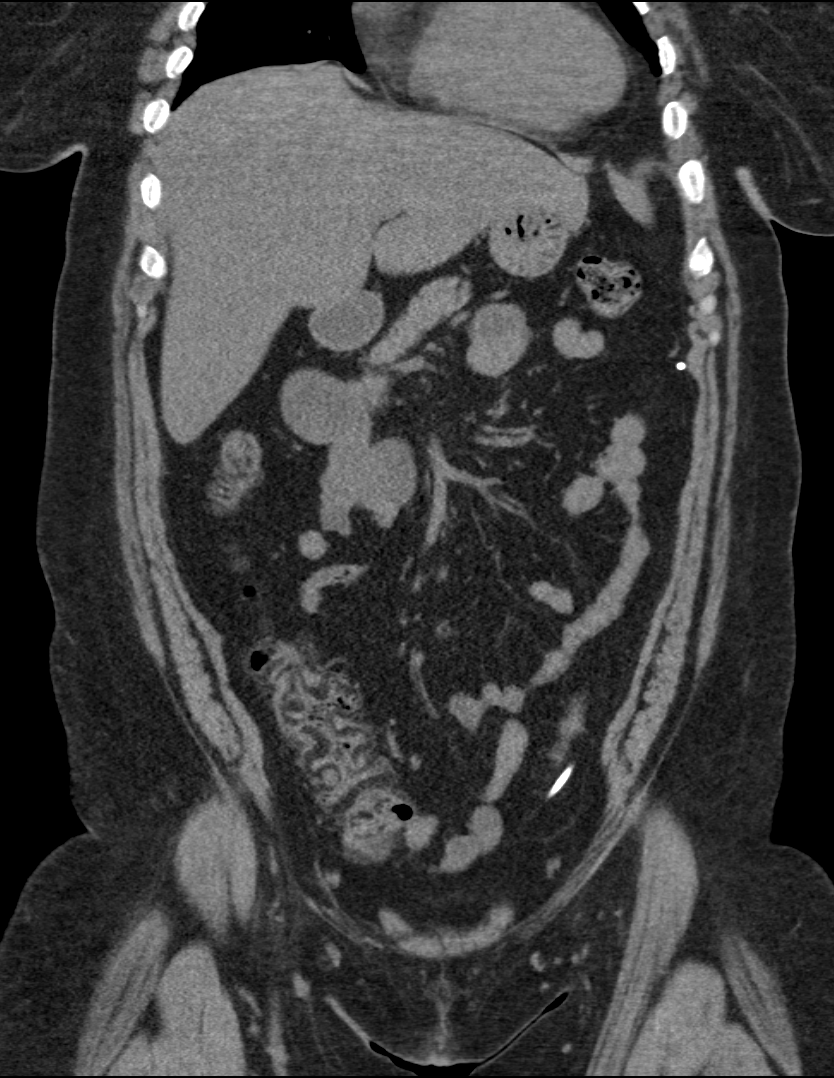
[im 56/101  soft-tissue]
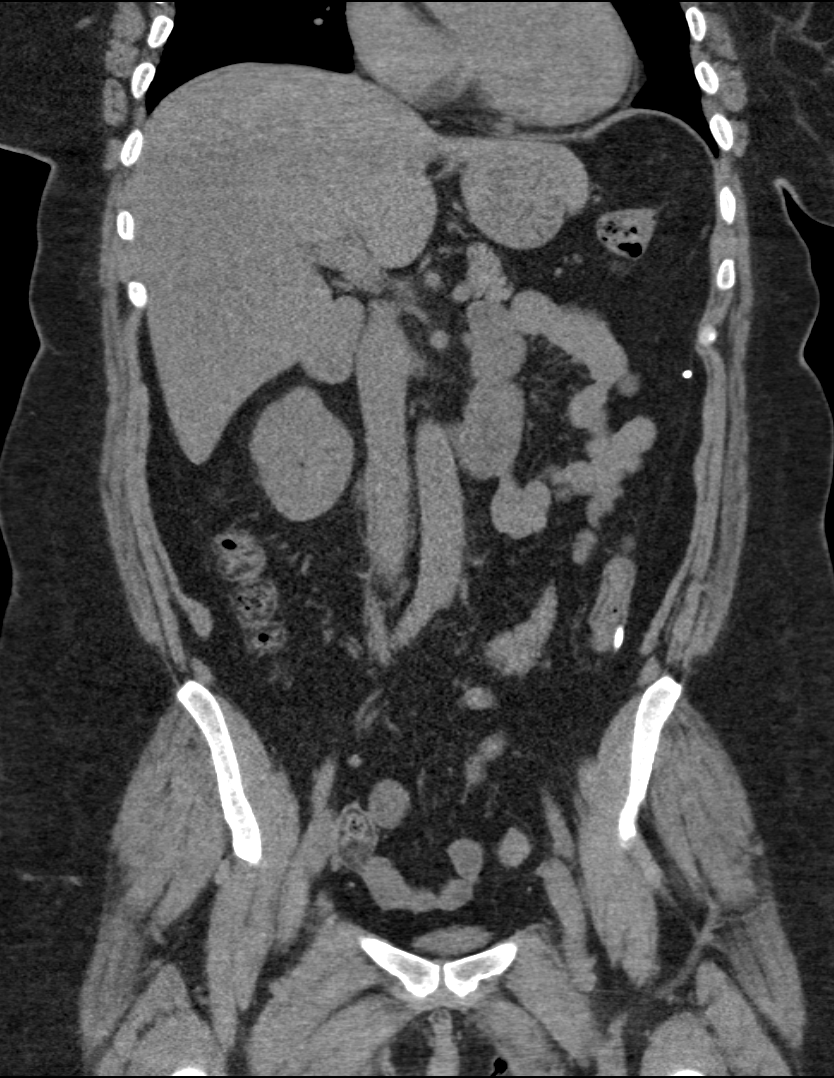

[11 of 46 positions shown; findings below may reference images not displayed]

FINDINGS: The lung bases are clear. The liver is unremarkable on this
unenhanced study. Surgical clips are present from prior
cholecystectomy. The pancreas is normal in size and the pancreatic
duct is not dilated. The adrenal glands and spleen are unremarkable.
There does appear to be a small hiatal hernia present. Otherwise the
stomach is decompressed and unremarkable. The descending duodenum
appears somewhat dilated, a finding of questionable significance. No
renal calculi are seen. There is no evidence of hydronephrosis. The
proximal ureters are normal in caliber. The abdominal aorta is
normal in caliber. A probable VP shunt catheter is noted with the
tip extending into the anterior left pelvis.

There is a tiny amount of fluid within the pelvis of questionable
significance. This may represent a recently ruptured ovarian cyst
with ovaries appear normal in size. The uterus has previously been
resected. The colon is largely decompressed. The terminal ileum is
unremarkable. The appendix is not definitely seen. The lumbar
vertebrae are in normal alignment with normal intervertebral disc
spaces.
IMPRESSION: 1. No explanation for the patient's right flank pain is seen. No
renal or ureteral calculi are noted.
2. Small amount of free fluid in the pelvis may be due to a recently
ruptured ovarian cyst. The uterus has previously been resected.
3. The terminal ileum is unremarkable. The appendix is not
visualized.
4. Apparent VP shunt catheter present with the tip in the anterior
left pelvis.

## 2017-05-14 ENCOUNTER — Other Ambulatory Visit: Payer: Self-pay | Admitting: Family Medicine

## 2017-05-14 DIAGNOSIS — Z1231 Encounter for screening mammogram for malignant neoplasm of breast: Secondary | ICD-10-CM

## 2017-05-23 ENCOUNTER — Ambulatory Visit: Payer: Medicare Other

## 2017-10-07 ENCOUNTER — Encounter (HOSPITAL_COMMUNITY): Payer: Self-pay

## 2017-10-07 ENCOUNTER — Other Ambulatory Visit: Payer: Self-pay

## 2017-10-07 ENCOUNTER — Emergency Department (HOSPITAL_COMMUNITY): Payer: Medicare Other

## 2017-10-07 ENCOUNTER — Emergency Department (HOSPITAL_COMMUNITY)
Admission: EM | Admit: 2017-10-07 | Discharge: 2017-10-07 | Disposition: A | Discharge status: Home / Self Care | Payer: Medicare Other | Attending: Emergency Medicine | Admitting: Emergency Medicine

## 2017-10-07 DIAGNOSIS — S8002XA Contusion of left knee, initial encounter: Secondary | ICD-10-CM | POA: Insufficient documentation

## 2017-10-07 DIAGNOSIS — Y939 Activity, unspecified: Secondary | ICD-10-CM | POA: Insufficient documentation

## 2017-10-07 DIAGNOSIS — Z79899 Other long term (current) drug therapy: Secondary | ICD-10-CM | POA: Diagnosis not present

## 2017-10-07 DIAGNOSIS — Y999 Unspecified external cause status: Secondary | ICD-10-CM | POA: Diagnosis not present

## 2017-10-07 DIAGNOSIS — Y929 Unspecified place or not applicable: Secondary | ICD-10-CM | POA: Diagnosis not present

## 2017-10-07 DIAGNOSIS — T148XXA Other injury of unspecified body region, initial encounter: Secondary | ICD-10-CM | POA: Insufficient documentation

## 2017-10-07 DIAGNOSIS — S8992XA Unspecified injury of left lower leg, initial encounter: Secondary | ICD-10-CM | POA: Diagnosis present

## 2017-10-07 DIAGNOSIS — X509XXA Other and unspecified overexertion or strenuous movements or postures, initial encounter: Secondary | ICD-10-CM | POA: Diagnosis not present

## 2017-10-07 DIAGNOSIS — Z87891 Personal history of nicotine dependence: Secondary | ICD-10-CM | POA: Diagnosis not present

## 2017-10-07 MED ORDER — METHOCARBAMOL 500 MG PO TABS
1000.0000 mg | ORAL_TABLET | Freq: Once | ORAL | Status: AC
  Administered 2017-10-07: 1000 mg via ORAL
  Filled 2017-10-07: qty 2

## 2017-10-07 MED ORDER — IBUPROFEN 600 MG PO TABS: 600.0000 mg | ORAL_TABLET | Freq: Three times a day (TID) | ORAL | 0 refills | Status: AC | PRN

## 2017-10-07 MED ORDER — METHOCARBAMOL 500 MG PO TABS: 1000.0000 mg | ORAL_TABLET | Freq: Three times a day (TID) | ORAL | 0 refills | Status: AC | PRN

## 2017-10-07 MED ORDER — IBUPROFEN 200 MG PO TABS
600.0000 mg | ORAL_TABLET | Freq: Once | ORAL | Status: AC
  Administered 2017-10-07: 600 mg via ORAL
  Filled 2017-10-07: qty 1

## 2017-10-07 NOTE — ED Notes (Signed)
Ortho at the bedside.

## 2017-10-07 NOTE — Progress Notes (Signed)
Orthopedic Tech Progress Note Patient Details:  Wendy Aguirre [birth year] [id number]  Ortho Devices Type of Ortho Device: Crutches, Knee Immobilizer Ortho Device/Splint Location: lle Ortho Device/Splint Interventions: Application   Susann Lawhorne 10/07/2017, 1:15 PM

## 2017-10-07 NOTE — ED Triage Notes (Signed)
Per Pt, Pt reports slipping and falling yesterday evening. She landed on her left knee and reports pain in her left knee and radiating to her left hip. Attempted to ice for relief and reports pain is worse this morning. Some swelling noted in the left knee.

## 2017-10-07 NOTE — ED Notes (Signed)
Paged Ortho about Crutches and Knee Immobilizer

## 2017-10-07 NOTE — ED Provider Notes (Signed)
[facility] EMERGENCY DEPARTMENT Provider Note   CSN: [id number] Arrival date & time: 10/07/17  1135     History   Chief Complaint Chief Complaint  Patient presents with  . Knee Pain    HPI Wendy Aguirre is a 56 y.o. female.  HPI Patient states yesterday she slipped and fell from standing on to left knee.  Reports immediate swelling and pain to the knee.  She also has had pain that started to radiate up her thigh to the low back.  Denies any numbness or tingling.  Range of motion makes the pain worse. Past Medical History:  Diagnosis Date  . Brain tumor (benign) ([place])   . GERD (gastroesophageal reflux disease)     There are no active problems to display for this patient.   Past Surgical History:  Procedure Laterality Date  . ABDOMINAL HYSTERECTOMY    . CHOLECYSTECTOMY    . KNEE SURGERY     L side  . VENTRICULOPERITONEAL SHUNT      OB History    No data available       Home Medications    Prior to Admission medications   Medication Sig Start Date End Date Taking? Authorizing Provider  cyclobenzaprine (FLEXERIL) 10 MG tablet Take 10 mg by mouth 3 (three) times daily as needed for muscle spasms.    [provider]  gabapentin (NEURONTIN) 800 MG tablet Take 800 mg by mouth 3 (three) times daily.    [provider]  HYDROcodone-acetaminophen (NORCO) 5-325 MG tablet Take 1 tablet by mouth every 6 (six) hours as needed for moderate pain. 11/24/15   Kirichenko, Tatyana, PA-C  ibuprofen (ADVIL,MOTRIN) 600 MG tablet Take 1 tablet (600 mg total) by mouth 3 (three) times daily with meals as needed for moderate pain. 10/07/17   Julianne Rice, MD  methocarbamol (ROBAXIN) 500 MG tablet Take 2 tablets (1,000 mg total) by mouth every 8 (eight) hours as needed. 10/07/17   Julianne Rice, MD  omeprazole (PRILOSEC) 40 MG capsule Take 40 mg by mouth 2 (two) times daily.     [provider]  ondansetron (ZOFRAN-ODT) 4 MG  disintegrating tablet Take 4 mg by mouth every 8 (eight) hours as needed for nausea or vomiting.    [provider]  oxyCODONE-acetaminophen (PERCOCET/ROXICET) 5-325 MG per tablet Take by mouth every 4 (four) hours as needed for severe pain.    [provider]  rOPINIRole (REQUIP) 0.5 MG tablet Take 0.5 mg by mouth 3 (three) times daily.    [provider]    Family History No family history on file.  Social History Social History   Tobacco Use  . Smoking status: Former Smoker  Substance Use Topics  . Alcohol use: No  . Drug use: No     Allergies   Lamotrigine; Penicillins; and Carbamazepine   Review of Systems Review of Systems  Constitutional: Negative for chills and fever.  Musculoskeletal: Positive for arthralgias, back pain, joint swelling and myalgias. Negative for neck pain and neck stiffness.  Skin: Negative for rash.  Neurological: Negative for weakness and numbness.  All other systems reviewed and are negative.    Physical Exam Updated Vital Signs BP (!) 133/98 (BP Location: Right Arm)   Pulse 83   Temp 97.7 F (36.5 C) (Oral)   Resp 16   Ht 5\' 2"  (1.575 m)   Wt 81.2 kg (179 lb)   SpO2 97%   BMI 32.74 kg/m   Physical Exam  Constitutional:  She is oriented to person, place, and time. She appears well-developed and well-nourished.  HENT:  Head: Normocephalic and atraumatic.  Eyes: EOM are normal. Pupils are equal, round, and reactive to light.  Neck: Normal range of motion. Neck supple.  Cardiovascular: Normal rate.  Pulmonary/Chest: Effort normal.  Abdominal: Soft.  Musculoskeletal: Normal range of motion. She exhibits no edema or tenderness.  No midline thoracic or lumbar tenderness to palpation.  Patient does have some left-sided paraspinal lumbar tenderness.  Patient also has some tenderness of the lateral surface of the left thigh.  No bony deformity.  Patient has full range of motion of the left knee.  She does have  tenderness over the medial surface of the knee and some contusion and swelling over the patella anteriorly.  There is no ligamentous instability.  Full range of motion of the left hip and ankle without pain.  Distal pulses are 2+.  Neurological: She is alert and oriented to person, place, and time.  5/5 motor in all extremities.  Sensation fully intact.  Skin: Skin is warm and dry. Capillary refill takes less than 2 seconds. No rash noted. No erythema.  Psychiatric: She has a normal mood and affect. Her behavior is normal.  Nursing note and vitals reviewed.    ED Treatments / Results  Labs (all labs ordered are listed, but only abnormal results are displayed) Labs Reviewed - No data to display  EKG  EKG Interpretation None       Radiology Dg Knee Complete 4 Views Left  Result Date: 10/07/2017 CLINICAL DATA:  Left knee pain since a slip and fall yesterday. Initial encounter. EXAM: LEFT KNEE - COMPLETE 4+ VIEW COMPARISON:  None. FINDINGS: No evidence of fracture, dislocation, or joint effusion. No evidence of arthropathy or other focal bone abnormality. Soft tissues are unremarkable. IMPRESSION: Normal exam. Electronically Signed   By: Inge Rise M.D.   On: 10/07/2017 12:11    Procedures Procedures (including critical care time)  Medications Ordered in ED Medications  ibuprofen (ADVIL,MOTRIN) tablet 600 mg (not administered)  methocarbamol (ROBAXIN) tablet 1,000 mg (not administered)     Initial Impression / Assessment and Plan / ED Course  I have reviewed the triage vital signs and the nursing notes.  Pertinent labs & imaging results that were available during my care of the patient were reviewed by me and considered in my medical decision making (see chart for details).     No obvious bony deformity on x-ray.  Likely knee contusion and muscle strain.  However cannot rule out ligamentous or cartilaginous injury.  Will place in straight leg brace and give orthopedic  follow-up if symptoms persist.  Final Clinical Impressions(s) / ED Diagnoses   Final diagnoses:  Contusion of left knee, initial encounter  Muscle strain    ED Discharge Orders        Ordered    ibuprofen (ADVIL,MOTRIN) 600 MG tablet  3 times daily with meals PRN     10/07/17 1251    methocarbamol (ROBAXIN) 500 MG tablet  Every 8 hours PRN     10/07/17 1251       Julianne Rice, MD 10/07/17 1253

## 2017-11-16 ENCOUNTER — Ambulatory Visit: Payer: Medicare Other

## 2017-12-20 ENCOUNTER — Ambulatory Visit
Admission: RE | Admit: 2017-12-20 | Discharge: 2017-12-20 | Disposition: A | Discharge status: Home / Self Care | Payer: Medicare Other | Source: Ambulatory Visit | Attending: Family Medicine | Admitting: Family Medicine

## 2017-12-20 ENCOUNTER — Ambulatory Visit: Payer: Medicare Other

## 2017-12-20 DIAGNOSIS — Z1231 Encounter for screening mammogram for malignant neoplasm of breast: Secondary | ICD-10-CM
